# Patient Record
Sex: Male | Born: 1960
Health system: Southern US, Community
[De-identification: ages and names within clinical notes are randomized; demographics above are authoritative.]

## PROBLEM LIST (undated history)

## (undated) HISTORY — PX: KNEE SURGERY: SHX244

---

## 2014-08-21 ENCOUNTER — Encounter: Payer: Self-pay | Admitting: Internal Medicine

## 2014-08-21 ENCOUNTER — Ambulatory Visit: Payer: Self-pay | Attending: Internal Medicine | Admitting: Internal Medicine

## 2014-08-21 VITALS — BP 117/80 | HR 100 | Temp 98.8°F | Resp 16 | Ht 66.0 in | Wt 175.0 lb

## 2014-08-21 DIAGNOSIS — S8991XA Unspecified injury of right lower leg, initial encounter: Secondary | ICD-10-CM

## 2014-08-21 DIAGNOSIS — M25561 Pain in right knee: Secondary | ICD-10-CM | POA: Insufficient documentation

## 2014-08-21 DIAGNOSIS — M25461 Effusion, right knee: Secondary | ICD-10-CM | POA: Insufficient documentation

## 2014-08-21 NOTE — Progress Notes (Signed)
Pt is here today to establish care. Pt states that behind his right knee has been very painful for about 3 weeks especially in the morning.

## 2014-08-21 NOTE — Patient Instructions (Signed)
Smokeless Tobacco Use Smokeless tobacco is a loose, fine, or stringy tobacco. The tobacco is not smoked like a cigarette, but it is chewed or held in the lips or cheeks. It resembles tea and comes from the leaves of the tobacco plant. Smokeless tobacco is usually flavored, sweetened, or processed in some way. Although smokeless tobacco is not smoked into the lungs, its chemicals are absorbed through the membranes in the mouth and into the bloodstream. Its chemicals are also swallowed in saliva. The chemicals (nicotine and other toxins) are known to cause cancer. Smokeless tobacco contains up to 28 differentcarcinogens. CAUSES Nicotine is addictive. Smokeless tobacco contains nicotine, which is a stimulant. This stimulant can give you a "buzz" or altered state. People can become addicted to the feeling it delivers.  SYMPTOMS Smokeless tobacco can cause health problems, including:  Bad breath.  Yellow-brown teeth.  Mouth sores.  Cracking and bleeding lips.  Gum disease, gum recession, and bone loss around the teeth.  Tooth decay.  Increased or irregular heart rate.  High blood pressure, heart disease, and stroke.  Cancer of the mouth, lips, tongue, pancreas, voice box (larynx), esophagus, colon, and bladder.  Precancerous lesion of the soft tissues of the mouth (leukoplakia).  Loss of your sense of taste. TREATMENT Talk with your caregiver about ways you can quit. Quitting tobacco is a good decision for your health. Nicotine is addictive, but several options are available to help you quit including:  Nicotine replacement therapy (gum or patch).  Support and cessation programs. The following tips can help you quit:  Write down the reasons you would like to quit and look at them often.  Set a date during a low stress time to stop or cut back.  Ask family and friends for their support.  Remove all tobacco products from your home and work.  Replace the chewing tobacco with  things like beef jerky, sunflower seeds, or shredded coconut.  Avoid situations that may make you want to chew tobacco.  Exercise and eat a healthy diet.  When you crave tobacco, distract yourself with drinking water, sugarless chewing gum, sugarless hard candy, exercising, or deep breathing. HOME CARE INSTRUCTIONS  See your dentist for regular oral health exams every 6 months.  Follow up with your caregiver as recommended. SEEK MEDICAL CARE OR DENTAL CARE IF:  You have bleeding or cracking lips, gums, or cheeks.  You have mouth sores, discolorations, or pain.  You have tooth pain.  You develop persistent irritation, burning, or sores in the mouth.  You have pain, tenderness, or numbness in the mouth.  You develop a lump, bumpy patch, or hardened skin inside the mouth.  The color changes inside your mouth (gray, white, or red spots).  You have difficulty chewing, swallowing, or speaking. Document Released: 11/11/2010 Document Revised: 09/01/2011 Document Reviewed: 11/11/2010 Exit[employer] Patient Information 2015 [employer]. This information is not intended to replace advice given to you by your health care provider. Make sure you discuss any questions you have with your health care provider.  

## 2014-08-21 NOTE — Progress Notes (Signed)
   Subjective:    Patient ID: Kenneth Lucero, male    DOB: 05/27/1961, 54 y.o.   MRN: [id number]  Knee Pain  The incident occurred more than 1 week ago (3 weeks). The incident occurred in the yard. The injury mechanism is unknown. The pain is present in the right knee. Quality: pain worse over past 4 days-feels like a tight sensation/pulling. The pain is at a severity of 9/10 (mostly in th emorning). The pain has been constant since onset. Associated symptoms comments: Swelling, pain mostly in the popliteal area. The symptoms are aggravated by weight bearing. He has tried ice, heat and rest for the symptoms. The treatment provided no relief.      Review of Systems  Musculoskeletal: Positive for joint swelling and arthralgias.  All other systems reviewed and are negative.      Objective:   Physical Exam  Cardiovascular: Normal rate, regular rhythm and normal heart sounds.   Pulmonary/Chest: Effort normal and breath sounds normal.  Abdominal: Soft. Bowel sounds are normal.  Musculoskeletal: He exhibits edema (right knee). He exhibits no tenderness.  Limited ROM Unable to fully bend  Skin: Skin is warm and dry.          Assessment & Plan:  Kenneth MaduroRobert was seen today for establish care.  Diagnoses and all orders for this visit:  Right knee injury, initial encounter Orders: -     MR Knee Right Wo Contrast; Future I have high suspicion that patient may have a tear. Will obtain MRI and call patient back with results. He states that he does not need pain medication, he will just take tylenol if needed.   Return if symptoms worsen or fail to improve.  Holland CommonsKECK, Yosiel Thieme, NP 08/22/2014 10:32 AM

## 2014-08-22 ENCOUNTER — Telehealth: Payer: Self-pay | Admitting: *Deleted

## 2014-08-22 NOTE — Telephone Encounter (Signed)
Pt aware of appointment 

## 2014-08-22 NOTE — Telephone Encounter (Signed)
MRI appointment on march 17,2016  At 01:00 arriving 15 early  Left voice message with male to return call

## 2014-09-04 ENCOUNTER — Ambulatory Visit: Payer: Self-pay | Attending: Internal Medicine

## 2014-09-07 ENCOUNTER — Ambulatory Visit (HOSPITAL_COMMUNITY)
Admission: RE | Admit: 2014-09-07 | Discharge: 2014-09-07 | Disposition: A | Discharge status: Home / Self Care | Payer: Self-pay | Source: Ambulatory Visit | Attending: Internal Medicine | Admitting: Internal Medicine

## 2014-09-07 DIAGNOSIS — M25561 Pain in right knee: Secondary | ICD-10-CM | POA: Insufficient documentation

## 2014-09-07 DIAGNOSIS — S8991XA Unspecified injury of right lower leg, initial encounter: Secondary | ICD-10-CM

## 2014-09-14 ENCOUNTER — Encounter: Payer: Self-pay | Admitting: Internal Medicine

## 2014-09-19 ENCOUNTER — Telehealth: Payer: Self-pay | Admitting: Internal Medicine

## 2014-09-19 NOTE — Telephone Encounter (Signed)
Pt called requesting MRI results, please f/u with pt to review results

## 2014-09-25 ENCOUNTER — Telehealth: Payer: Self-pay | Admitting: *Deleted

## 2014-09-25 NOTE — Telephone Encounter (Signed)
Left message on VM to return my call. 

## 2014-09-25 NOTE — Telephone Encounter (Signed)
-----   Message from Ambrose FinlandValerie A Keck, NP sent at 09/14/2014  6:17 PM EDT ----- Small baker's cyst and joint effusion. Patient will need ortho referral for treatment. Thanks

## 2014-10-13 ENCOUNTER — Encounter: Payer: Self-pay | Admitting: *Deleted

## 2014-10-13 DIAGNOSIS — M7120 Synovial cyst of popliteal space [Baker], unspecified knee: Secondary | ICD-10-CM

## 2014-10-13 NOTE — Progress Notes (Signed)
Gave patient results of MRI and put in ortho referral

## 2017-09-09 ENCOUNTER — Encounter: Payer: Self-pay | Admitting: Nurse Practitioner

## 2017-09-09 ENCOUNTER — Ambulatory Visit: Payer: Self-pay | Attending: Nurse Practitioner | Admitting: Nurse Practitioner

## 2017-09-09 VITALS — BP 125/88 | HR 92 | Temp 97.9°F | Ht 66.0 in | Wt 176.4 lb

## 2017-09-09 DIAGNOSIS — M5442 Lumbago with sciatica, left side: Secondary | ICD-10-CM | POA: Insufficient documentation

## 2017-09-09 DIAGNOSIS — M5441 Lumbago with sciatica, right side: Secondary | ICD-10-CM | POA: Insufficient documentation

## 2017-09-09 DIAGNOSIS — M549 Dorsalgia, unspecified: Secondary | ICD-10-CM | POA: Insufficient documentation

## 2017-09-09 DIAGNOSIS — G8929 Other chronic pain: Secondary | ICD-10-CM | POA: Insufficient documentation

## 2017-09-09 MED ORDER — NAPROXEN 500 MG PO TABS: 500.0000 mg | ORAL_TABLET | Freq: Two times a day (BID) | ORAL | 0 refills | Status: DC

## 2017-09-09 MED ORDER — CYCLOBENZAPRINE HCL 10 MG PO TABS: 10.0000 mg | ORAL_TABLET | Freq: Three times a day (TID) | ORAL | 0 refills | Status: DC | PRN

## 2017-09-09 MED FILL — NAPROXEN 500 MG TABLET: 500 | 30 days supply | Qty: 60 | Fill #0

## 2017-09-09 MED FILL — CYCLOBENZAPRINE 10 MG TAB: 10 | 20 days supply | Qty: 60 | Fill #0

## 2017-09-09 NOTE — Progress Notes (Signed)
Assessment & Plan:  [name] was seen today for establish care and back pain.  Diagnoses and all orders for this visit:  Chronic bilateral low back pain with bilateral sciatica -     naproxen (NAPROSYN) 500 MG tablet; Take 1 tablet (500 mg total) by mouth 2 (two) times daily with a meal. -     cyclobenzaprine (FLEXERIL) 10 MG tablet; Take 1 tablet (10 mg total) by mouth 3 (three) times daily as needed for muscle spasms.  Work on losing weight to help reduce back pain. May alternate with heat and ice application for pain relief. May also alternate with acetaminophen as prescribed for back pain. Other alternatives include massage, acupuncture and water aerobics.  You must stay active and avoid a sedentary lifestyle.      Patient has been counseled on age-appropriate routine health concerns for screening and prevention. These are reviewed and up-to-date. Referrals have been placed accordingly. Immunizations are up-to-date or declined.    Subjective:   Chief Complaint  Patient presents with  . Establish Care    Patient is here to establish care for back pain.   . Back Pain    Patient stated been going on for a year and get worst. Pt's stated he think he twisted a muscle on his back.  Put ice no helps.    HPI Kenneth Lucero C Bolon 57 y.o. male presents to office today to establish care.   Chronic Back Pain He injured his back a year ago picking up large tree stumps. Now with worsening back pain. Pain described as dull and achy.  Aggravating factors: bending, sitting, lying on back or prone position. Relieving factors: ice application, stretching exercises and lying on side, standing. Pain is present most of the day. He has taken Equate ASA XS. He does endorse numbness in his bilateral legs after driving for long distances.   Review of Systems  Constitutional: Negative for fever, malaise/fatigue and weight loss.  HENT: Negative.  Negative for nosebleeds.   Eyes: Negative.  Negative for  blurred vision, double vision and photophobia.  Respiratory: Negative.  Negative for cough and shortness of breath.   Cardiovascular: Negative.  Negative for chest pain, palpitations and leg swelling.  Gastrointestinal: Negative.  Negative for heartburn, nausea and vomiting.  Musculoskeletal: Positive for back pain and myalgias.  Neurological: Negative.  Negative for dizziness, focal weakness, seizures and headaches.  Psychiatric/Behavioral: Negative.  Negative for suicidal ideas.    History reviewed. No pertinent past medical history.  Past Surgical History:  Procedure Laterality Date  . KNEE SURGERY Right     Family History  Problem Relation Age of Onset  . Cancer Neg Hx   . Diabetes Neg Hx   . Hypertension Neg Hx     Social History Reviewed with no changes to be made today.   No outpatient medications prior to visit.   No facility-administered medications prior to visit.     No Known Allergies     Objective:    BP 125/88 (BP Location: Right Arm, Patient Position: Sitting, Cuff Size: Normal)   Pulse 92   Temp 97.9 F (36.6 C) (Oral)   Ht 5\' 6"  (1.676 m)   Wt 176 lb 6.4 oz (80 kg)   SpO2 98%   BMI 28.47 kg/m  Wt Readings from Last 3 Encounters:  09/09/17 176 lb 6.4 oz (80 kg)  08/21/14 175 lb (79.4 kg)    Physical Exam  Constitutional: He is oriented to person, place, and time.  He appears well-developed and well-nourished. He is cooperative.  HENT:  Head: Normocephalic and atraumatic.    Eyes: EOM are normal.  Neck: Normal range of motion.  Cardiovascular: Normal rate, regular rhythm, normal heart sounds and intact distal pulses. Exam reveals no gallop and no friction rub.  No murmur heard. Pulmonary/Chest: Effort normal and breath sounds normal. No tachypnea. He has no decreased breath sounds. He has no rhonchi.  Abdominal: Soft. Bowel sounds are normal.  Musculoskeletal: Normal range of motion. He exhibits no edema.       Lumbar back: He exhibits pain.   Neurological: He is alert and oriented to person, place, and time. Coordination normal.  Skin: Skin is warm and dry.  Psychiatric: He has a normal mood and affect. His behavior is normal. Judgment and thought content normal.  Nursing note and vitals reviewed.     Patient has been counseled extensively about nutrition and exercise as well as the importance of adherence with medications and regular follow-up. The patient was given clear instructions to go to ER or return to medical center if symptoms don't improve, worsen or new problems develop. The patient verbalized understanding.   Follow-up: Return in about 3 weeks (around 09/30/2017) for back pain.   Claiborne Rigg, FNP-BC [facility] [place], [place] [phone]   09/09/2017, 10:38 AM

## 2017-09-09 NOTE — Patient Instructions (Addendum)
Back Exercises The following exercises strengthen the muscles that help to support the back. They also help to keep the lower back flexible. Doing these exercises can help to prevent back pain or lessen existing pain. If you have back pain or discomfort, try doing these exercises 2-3 times each day or as told by your health care provider. When the pain goes away, do them once each day, but increase the number of times that you repeat the steps for each exercise (do more repetitions). If you do not have back pain or discomfort, do these exercises once each day or as told by your health care provider. Exercises Single Knee to Chest  Repeat these steps 3-5 times for each leg: 1. Lie on your back on a firm bed or the floor with your legs extended. 2. Bring one knee to your chest. Your other leg should stay extended and in contact with the floor. 3. Hold your knee in place by grabbing your knee or thigh. 4. Pull on your knee until you feel a gentle stretch in your lower back. 5. Hold the stretch for 10-30 seconds. 6. Slowly release and straighten your leg.  Pelvic Tilt  Repeat these steps 5-10 times: 1. Lie on your back on a firm bed or the floor with your legs extended. 2. Bend your knees so they are pointing toward the ceiling and your feet are flat on the floor. 3. Tighten your lower abdominal muscles to press your lower back against the floor. This motion will tilt your pelvis so your tailbone points up toward the ceiling instead of pointing to your feet or the floor. 4. With gentle tension and even breathing, hold this position for 5-10 seconds.  Cat-Cow  Repeat these steps until your lower back becomes more flexible: 1. Get into a hands-and-knees position on a firm surface. Keep your hands under your shoulders, and keep your knees under your hips. You may place padding under your knees for comfort. 2. Let your head hang down, and point your tailbone toward the floor so your lower back  becomes rounded like the back of a cat. 3. Hold this position for 5 seconds. 4. Slowly lift your head and point your tailbone up toward the ceiling so your back forms a sagging arch like the back of a cow. 5. Hold this position for 5 seconds.  Press-Ups  Repeat these steps 5-10 times: 1. Lie on your abdomen (face-down) on the floor. 2. Place your palms near your head, about shoulder-width apart. 3. While you keep your back as relaxed as possible and keep your hips on the floor, slowly straighten your arms to raise the top half of your body and lift your shoulders. Do not use your back muscles to raise your upper torso. You may adjust the placement of your hands to make yourself more comfortable. 4. Hold this position for 5 seconds while you keep your back relaxed. 5. Slowly return to lying flat on the floor.  Bridges  Repeat these steps 10 times: 1. Lie on your back on a firm surface. 2. Bend your knees so they are pointing toward the ceiling and your feet are flat on the floor. 3. Tighten your buttocks muscles and lift your buttocks off of the floor until your waist is at almost the same height as your knees. You should feel the muscles working in your buttocks and the back of your thighs. If you do not feel these muscles, slide your feet 1-2 inches farther away   from your buttocks. 4. Hold this position for 3-5 seconds. 5. Slowly lower your hips to the starting position, and allow your buttocks muscles to relax completely.  If this exercise is too easy, try doing it with your arms crossed over your chest. Abdominal Crunches  Repeat these steps 5-10 times: 1. Lie on your back on a firm bed or the floor with your legs extended. 2. Bend your knees so they are pointing toward the ceiling and your feet are flat on the floor. 3. Cross your arms over your chest. 4. Tip your chin slightly toward your chest without bending your neck. 5. Tighten your abdominal muscles and slowly raise your  trunk (torso) high enough to lift your shoulder blades a tiny bit off of the floor. Avoid raising your torso higher than that, because it can put too much stress on your low back and it does not help to strengthen your abdominal muscles. 6. Slowly return to your starting position.  Back Lifts Repeat these steps 5-10 times: 1. Lie on your abdomen (face-down) with your arms at your sides, and rest your forehead on the floor. 2. Tighten the muscles in your legs and your buttocks. 3. Slowly lift your chest off of the floor while you keep your hips pressed to the floor. Keep the back of your head in line with the curve in your back. Your eyes should be looking at the floor. 4. Hold this position for 3-5 seconds. 5. Slowly return to your starting position.  Contact a health care provider if:  Your back pain or discomfort gets much worse when you do an exercise.  Your back pain or discomfort does not lessen within 2 hours after you exercise. If you have any of these problems, stop doing these exercises right away. Do not do them again unless your health care provider says that you can. Get help right away if:  You develop sudden, severe back pain. If this happens, stop doing the exercises right away. Do not do them again unless your health care provider says that you can. This information is not intended to replace advice given to you by your health care provider. Make sure you discuss any questions you have with your health care provider. Document Released: 07/17/2004 Document Revised: 10/17/2015 Document Reviewed: 08/03/2014 Elsevier Interactive Patient Education  2017 [employer]  Back Pain, Adult Back pain is very common. The pain often gets better over time. The cause of back pain is usually not dangerous. Most people can learn to manage their back pain on their own. Follow these instructions at home: Watch your back pain for any changes. The following actions may help to lessen any pain  you are feeling:  Stay active. Start with short walks on flat ground if you can. Try to walk farther each day.  Exercise regularly as told by your doctor. Exercise helps your back heal faster. It also helps avoid future injury by keeping your muscles strong and flexible.  Do not sit, drive, or stand in one place for more than 30 minutes.  Do not stay in bed. Resting more than 1-2 days can slow down your recovery.  Be careful when you bend or lift an object. Use good form when lifting: ? Bend at your knees. ? Keep the object close to your body. ? Do not twist.  Sleep on a firm mattress. Lie on your side, and bend your knees. If you lie on your back, put a pillow under your knees.    Take medicines only as told by your doctor.  Put ice on the injured area. ? Put ice in a plastic bag. ? Place a towel between your skin and the bag. ? Leave the ice on for 20 minutes, 2-3 times a day for the first 2-3 days. After that, you can switch between ice and heat packs.  Avoid feeling anxious or stressed. Find good ways to deal with stress, such as exercise.  Maintain a healthy weight. Extra weight puts stress on your back.  Contact a doctor if:  You have pain that does not go away with rest or medicine.  You have worsening pain that goes down into your legs or buttocks.  You have pain that does not get better in one week.  You have pain at night.  You lose weight.  You have a fever or chills. Get help right away if:  You cannot control when you poop (bowel movement) or pee (urinate).  Your arms or legs feel weak.  Your arms or legs lose feeling (numbness).  You feel sick to your stomach (nauseous) or throw up (vomit).  You have belly (abdominal) pain.  You feel like you may pass out (faint). This information is not intended to replace advice given to you by your health care provider. Make sure you discuss any questions you have with your health care provider. Document Released:  11/26/2007 Document Revised: 11/15/2015 Document Reviewed: 10/11/2013 Elsevier Interactive Patient Education  2018 [employer]  

## 2017-09-14 ENCOUNTER — Ambulatory Visit: Payer: Self-pay | Attending: Nurse Practitioner

## 2017-09-30 ENCOUNTER — Ambulatory Visit: Payer: Self-pay | Attending: Nurse Practitioner | Admitting: Nurse Practitioner

## 2017-09-30 ENCOUNTER — Encounter: Payer: Self-pay | Admitting: Nurse Practitioner

## 2017-09-30 VITALS — BP 112/79 | HR 95 | Temp 97.9°F | Ht 66.0 in | Wt 177.0 lb

## 2017-09-30 DIAGNOSIS — M5441 Lumbago with sciatica, right side: Secondary | ICD-10-CM | POA: Insufficient documentation

## 2017-09-30 DIAGNOSIS — G8929 Other chronic pain: Secondary | ICD-10-CM | POA: Insufficient documentation

## 2017-09-30 DIAGNOSIS — M5442 Lumbago with sciatica, left side: Secondary | ICD-10-CM | POA: Insufficient documentation

## 2017-09-30 MED ORDER — CYCLOBENZAPRINE HCL 10 MG PO TABS: 10.0000 mg | ORAL_TABLET | Freq: Three times a day (TID) | ORAL | 1 refills | Status: DC | PRN

## 2017-09-30 MED ORDER — NAPROXEN 500 MG PO TABS
500.0000 mg | ORAL_TABLET | Freq: Two times a day (BID) | ORAL | 1 refills | Status: DC
Start: 2017-09-30 — End: 2017-11-30

## 2017-09-30 NOTE — Patient Instructions (Signed)
Back Exercises The following exercises strengthen the muscles that help to support the back. They also help to keep the lower back flexible. Doing these exercises can help to prevent back pain or lessen existing pain. If you have back pain or discomfort, try doing these exercises 2-3 times each day or as told by your health care provider. When the pain goes away, do them once each day, but increase the number of times that you repeat the steps for each exercise (do more repetitions). If you do not have back pain or discomfort, do these exercises once each day or as told by your health care provider. Exercises Single Knee to Chest  Repeat these steps 3-5 times for each leg: 1. Lie on your back on a firm bed or the floor with your legs extended. 2. Bring one knee to your chest. Your other leg should stay extended and in contact with the floor. 3. Hold your knee in place by grabbing your knee or thigh. 4. Pull on your knee until you feel a gentle stretch in your lower back. 5. Hold the stretch for 10-30 seconds. 6. Slowly release and straighten your leg.  Pelvic Tilt  Repeat these steps 5-10 times: 1. Lie on your back on a firm bed or the floor with your legs extended. 2. Bend your knees so they are pointing toward the ceiling and your feet are flat on the floor. 3. Tighten your lower abdominal muscles to press your lower back against the floor. This motion will tilt your pelvis so your tailbone points up toward the ceiling instead of pointing to your feet or the floor. 4. With gentle tension and even breathing, hold this position for 5-10 seconds.  Cat-Cow  Repeat these steps until your lower back becomes more flexible: 1. Get into a hands-and-knees position on a firm surface. Keep your hands under your shoulders, and keep your knees under your hips. You may place padding under your knees for comfort. 2. Let your head hang down, and point your tailbone toward the floor so your lower back  becomes rounded like the back of a cat. 3. Hold this position for 5 seconds. 4. Slowly lift your head and point your tailbone up toward the ceiling so your back forms a sagging arch like the back of a cow. 5. Hold this position for 5 seconds.  Press-Ups  Repeat these steps 5-10 times: 1. Lie on your abdomen (face-down) on the floor. 2. Place your palms near your head, about shoulder-width apart. 3. While you keep your back as relaxed as possible and keep your hips on the floor, slowly straighten your arms to raise the top half of your body and lift your shoulders. Do not use your back muscles to raise your upper torso. You may adjust the placement of your hands to make yourself more comfortable. 4. Hold this position for 5 seconds while you keep your back relaxed. 5. Slowly return to lying flat on the floor.  Bridges  Repeat these steps 10 times: 1. Lie on your back on a firm surface. 2. Bend your knees so they are pointing toward the ceiling and your feet are flat on the floor. 3. Tighten your buttocks muscles and lift your buttocks off of the floor until your waist is at almost the same height as your knees. You should feel the muscles working in your buttocks and the back of your thighs. If you do not feel these muscles, slide your feet 1-2 inches farther away   from your buttocks. 4. Hold this position for 3-5 seconds. 5. Slowly lower your hips to the starting position, and allow your buttocks muscles to relax completely.  If this exercise is too easy, try doing it with your arms crossed over your chest. Abdominal Crunches  Repeat these steps 5-10 times: 1. Lie on your back on a firm bed or the floor with your legs extended. 2. Bend your knees so they are pointing toward the ceiling and your feet are flat on the floor. 3. Cross your arms over your chest. 4. Tip your chin slightly toward your chest without bending your neck. 5. Tighten your abdominal muscles and slowly raise your  trunk (torso) high enough to lift your shoulder blades a tiny bit off of the floor. Avoid raising your torso higher than that, because it can put too much stress on your low back and it does not help to strengthen your abdominal muscles. 6. Slowly return to your starting position.  Back Lifts Repeat these steps 5-10 times: 1. Lie on your abdomen (face-down) with your arms at your sides, and rest your forehead on the floor. 2. Tighten the muscles in your legs and your buttocks. 3. Slowly lift your chest off of the floor while you keep your hips pressed to the floor. Keep the back of your head in line with the curve in your back. Your eyes should be looking at the floor. 4. Hold this position for 3-5 seconds. 5. Slowly return to your starting position.  Contact a health care provider if:  Your back pain or discomfort gets much worse when you do an exercise.  Your back pain or discomfort does not lessen within 2 hours after you exercise. If you have any of these problems, stop doing these exercises right away. Do not do them again unless your health care provider says that you can. Get help right away if:  You develop sudden, severe back pain. If this happens, stop doing the exercises right away. Do not do them again unless your health care provider says that you can. This information is not intended to replace advice given to you by your health care provider. Make sure you discuss any questions you have with your health care provider. Document Released: 07/17/2004 Document Revised: 10/17/2015 Document Reviewed: 08/03/2014 Elsevier Interactive Patient Education  2017 [employer]  Chronic Back Pain When back pain lasts longer than 3 months, it is called chronic back pain.The cause of your back pain may not be known. Some common causes include:  Wear and tear (degenerative disease) of the bones, ligaments, or disks in your back.  Inflammation and stiffness in your back  (arthritis).  People who have chronic back pain often go through certain periods in which the pain is more intense (flare-ups). Many people can learn to manage the pain with home care. Follow these instructions at home: Pay attention to any changes in your symptoms. Take these actions to help with your pain: Activity  Avoid bending and activities that make the problem worse.  Do not sit or stand in one place for long periods of time.  Take brief periods of rest throughout the day. This will reduce your pain. Resting in a lying or standing position is usually better than sitting to rest.  When you are resting for longer periods, mix in some mild activity or stretching between periods of rest. This will help to prevent stiffness and pain.  Get regular exercise. Ask your health care provider what activities  are safe for you.  Do not lift anything that is heavier than 10 lb (4.5 kg). Always use proper lifting technique, which includes: ? Bending your knees. ? Keeping the load close to your body. ? Avoiding twisting. Managing pain  If directed, apply ice to the painful area. Your health care provider may recommend applying ice during the first 24-48 hours after a flare-up begins. ? Put ice in a plastic bag. ? Place a towel between your skin and the bag. ? Leave the ice on for 20 minutes, 2-3 times per day.  After icing, apply heat to the affected area as often as told by your health care provider. Use the heat source that your health care provider recommends, such as a moist heat pack or a heating pad. ? Place a towel between your skin and the heat source. ? Leave the heat on for 20-30 minutes. ? Remove the heat if your skin turns bright red. This is especially important if you are unable to feel pain, heat, or cold. You may have a greater risk of getting burned.  Try soaking in a warm tub.  Take over-the-counter and prescription medicines only as told by your health care  provider.  Keep all follow-up visits as told by your health care provider. This is important. Contact a health care provider if:  You have pain that is not relieved with rest or medicine. Get help right away if:  You have weakness or numbness in one or both of your legs or feet.  You have trouble controlling your bladder or your bowels.  You have nausea or vomiting.  You have pain in your abdomen.  You have shortness of breath or you faint. This information is not intended to replace advice given to you by your health care provider. Make sure you discuss any questions you have with your health care provider. Document Released: 07/17/2004 Document Revised: 10/18/2015 Document Reviewed: 11/27/2014 Elsevier Interactive Patient Education  2018 [employer]

## 2017-09-30 NOTE — Progress Notes (Signed)
Assessment & Plan:  Kenneth MaduroRobert was seen today for follow-up.  Diagnoses and all orders for this visit:  Chronic bilateral low back pain with bilateral sciatica -     cyclobenzaprine (FLEXERIL) 10 MG tablet; Take 1 tablet (10 mg total) by mouth 3 (three) times daily as needed for muscle spasms. -     naproxen (NAPROSYN) 500 MG tablet; Take 1 tablet (500 mg total) by mouth 2 (two) times daily with a meal. Work on losing weight to help reduce back pain. May alternate with heat and ice application for pain relief. May also alternate with acetaminophen  as prescribed for back pain. Other alternatives include massage, acupuncture and water aerobics.  You must stay active and avoid a sedentary lifestyle.    Patient has been counseled on age-appropriate routine health concerns for screening and prevention. These are reviewed and up-to-date. Referrals have been placed accordingly. Immunizations are up-to-date or declined.    Subjective:   Chief Complaint  Patient presents with  . Follow-up    Pt. is here for a follow-up on back pain. Pt. stated back pain is tolerable and Flexeril is helping him. In process of doing [employer] Assistance.    HPI Kenneth Lucero 57 y.o. male presents to office today for follow up to back pain.   Back Pain This is chronic in nature and has been ongoing for over a year now.  Patient had an injury to his back due to peaking up a large trees.  He endorses pain is dull and achy and persistent.  Aggravating factors of bending, sitting, lying on back or prone.  At his last appointment with me on 09/09/2017 he was prescribed Flexeril and naproxen which he reports today have provided significant relief of his symptoms.  At this time will continue with the same medications.  Patient does need imaging on his lumbar spine however he uninsured and is still waiting to be approved for the orange colored/financial assistance.  Review of Systems  Constitutional: Negative for fever,  malaise/fatigue and weight loss.  Eyes: Negative.  Negative for blurred vision, double vision and photophobia.  Respiratory: Negative.  Negative for cough and shortness of breath.   Cardiovascular: Negative.  Negative for chest pain, palpitations and leg swelling.  Gastrointestinal: Negative.  Negative for heartburn, nausea and vomiting.  Musculoskeletal: Positive for back pain and neck pain. Negative for myalgias.       SEE HPI  Neurological: Negative.  Negative for dizziness, focal weakness, seizures and headaches.  Psychiatric/Behavioral: Negative.  Negative for suicidal ideas.    History reviewed. No pertinent past medical history.  Past Surgical History:  Procedure Laterality Date  . KNEE SURGERY Right     Family History  Problem Relation Age of Onset  . Cancer Neg Hx   . Diabetes Neg Hx   . Hypertension Neg Hx     Social History Reviewed with no changes to be made today.   Outpatient Medications Prior to Visit  Medication Sig Dispense Refill  . cyclobenzaprine (FLEXERIL) 10 MG tablet Take 1 tablet (10 mg total) by mouth 3 (three) times daily as needed for muscle spasms. 60 tablet 0  . naproxen (NAPROSYN) 500 MG tablet Take 1 tablet (500 mg total) by mouth 2 (two) times daily with a meal. 60 tablet 0   No facility-administered medications prior to visit.     No Known Allergies     Objective:    BP 112/79 (BP Location: Right Arm, Patient Position: Sitting, Cuff  Size: Normal)   Pulse 95   Temp 97.9 F (36.6 C) (Oral)   Ht 5\' 6"  (1.676 m)   Wt 177 lb (80.3 kg)   SpO2 98%   BMI 28.57 kg/m  Wt Readings from Last 3 Encounters:  09/30/17 177 lb (80.3 kg)  09/09/17 176 lb 6.4 oz (80 kg)  08/21/14 175 lb (79.4 kg)    Physical Exam  Constitutional: He is oriented to person, place, and time. He appears well-developed and well-nourished. He is cooperative.  HENT:  Head: Normocephalic and atraumatic.  Eyes: EOM are normal.  Neck: Normal range of motion.    Cardiovascular: Normal rate, regular rhythm and normal heart sounds. Exam reveals no gallop and no friction rub.  No murmur heard. Pulmonary/Chest: Effort normal and breath sounds normal. No tachypnea. No respiratory distress. He has no decreased breath sounds. He has no wheezes. He has no rhonchi. He has no rales. He exhibits no tenderness.  Abdominal: Soft. Bowel sounds are normal.  Musculoskeletal: Normal range of motion. He exhibits no edema.       Lumbar back: He exhibits pain. He exhibits no swelling and no edema.  Neurological: He is alert and oriented to person, place, and time. Coordination normal.  Skin: Skin is warm and dry.  Psychiatric: He has a normal mood and affect. His behavior is normal. Judgment and thought content normal.  Nursing note and vitals reviewed.     Patient has been counseled extensively about nutrition and exercise as well as the importance of adherence with medications and regular follow-up. The patient was given clear instructions to go to ER or return to medical center if symptoms don't improve, worsen or new problems develop. The patient verbalized understanding.   Follow-up: Return in about 2 months (around 11/30/2017).   Claiborne Rigg, FNP-BC [facility] [place], [place] [phone]   09/30/2017, 1:34 PM

## 2017-10-23 MED FILL — NAPROXEN 500 MG TABLET: 500 | 30 days supply | Qty: 60 | Fill #0

## 2017-10-23 MED FILL — CYCLOBENZAPRINE 10 MG TAB: 10 | 20 days supply | Qty: 60 | Fill #0

## 2017-11-06 ENCOUNTER — Telehealth: Payer: Self-pay | Admitting: Nurse Practitioner

## 2017-11-06 ENCOUNTER — Ambulatory Visit: Payer: Self-pay | Attending: Nurse Practitioner

## 2017-11-06 NOTE — Telephone Encounter (Signed)
I called Pt, LVM to pick up his blue and [place] at the front office

## 2017-11-30 ENCOUNTER — Ambulatory Visit: Payer: Self-pay | Attending: Nurse Practitioner | Admitting: Nurse Practitioner

## 2017-11-30 ENCOUNTER — Encounter: Payer: Self-pay | Admitting: Nurse Practitioner

## 2017-11-30 VITALS — BP 123/84 | HR 92 | Temp 98.1°F | Ht 66.0 in | Wt 172.0 lb

## 2017-11-30 DIAGNOSIS — M5441 Lumbago with sciatica, right side: Secondary | ICD-10-CM | POA: Insufficient documentation

## 2017-11-30 DIAGNOSIS — M5442 Lumbago with sciatica, left side: Secondary | ICD-10-CM | POA: Insufficient documentation

## 2017-11-30 DIAGNOSIS — Z09 Encounter for follow-up examination after completed treatment for conditions other than malignant neoplasm: Secondary | ICD-10-CM | POA: Insufficient documentation

## 2017-11-30 DIAGNOSIS — Z9889 Other specified postprocedural states: Secondary | ICD-10-CM | POA: Insufficient documentation

## 2017-11-30 DIAGNOSIS — G8929 Other chronic pain: Secondary | ICD-10-CM

## 2017-11-30 DIAGNOSIS — Z79899 Other long term (current) drug therapy: Secondary | ICD-10-CM | POA: Insufficient documentation

## 2017-11-30 DIAGNOSIS — R221 Localized swelling, mass and lump, neck: Secondary | ICD-10-CM

## 2017-11-30 DIAGNOSIS — Z0000 Encounter for general adult medical examination without abnormal findings: Secondary | ICD-10-CM

## 2017-11-30 MED ORDER — OMEPRAZOLE 20 MG PO CPDR: 20.0000 mg | DELAYED_RELEASE_CAPSULE | Freq: Every day | ORAL | 3 refills | Status: DC

## 2017-11-30 MED ORDER — CYCLOBENZAPRINE HCL 10 MG PO TABS: 10.0000 mg | ORAL_TABLET | Freq: Three times a day (TID) | ORAL | 1 refills | Status: DC | PRN

## 2017-11-30 MED ORDER — NAPROXEN 500 MG PO TABS: 500.0000 mg | ORAL_TABLET | Freq: Two times a day (BID) | ORAL | 1 refills | Status: DC

## 2017-11-30 MED FILL — NAPROXEN 500 MG TABLET: 500 | 30 days supply | Qty: 60 | Fill #0

## 2017-11-30 MED FILL — CYCLOBENZAPRINE 10 MG TAB: 10 | 20 days supply | Qty: 60 | Fill #0

## 2017-11-30 MED FILL — OMEPRAZOLE 20 MG CAP: 20 | 30 days supply | Qty: 30 | Fill #0

## 2017-11-30 NOTE — Progress Notes (Signed)
Assessment & Plan:  Kenneth Lucero was seen today for follow-up.  Diagnoses and all orders for this visit:  Chronic bilateral low back pain with bilateral sciatica -     DG Lumbar Spine Complete; Future -     cyclobenzaprine (FLEXERIL) 10 MG tablet; Take 1 tablet (10 mg total) by mouth 3 (three) times daily as needed for muscle spasms. -     naproxen (NAPROSYN) 500 MG tablet; Take 1 tablet (500 mg total) by mouth 2 (two) times daily with a meal. -     omeprazole (PRILOSEC) 20 MG capsule; Take 1 capsule (20 mg total) by mouth daily. (prophylaxis for Naproxen/Ulcer reducer)  Work on losing weight to help reduce back pain. May alternate with heat and ice application for pain relief. May also alternate with acetaminophen as prescribed for back pain. Other alternatives include massage, acupuncture and water aerobics.  You must stay active and avoid a sedentary lifestyle.  Neck mass -     US Soft Tissue Head/Neck; Future  Routine adult health maintenance -     Hepatitis C Antibody -     HIV antibody (with reflex) -     CBC -     CMP14+EGFR -     Lipid panel -     PSA    Patient has been counseled on age-appropriate routine health concerns for screening and prevention. These are reviewed and up-to-date. Referrals have been placed accordingly. Immunizations are up-to-date or declined.    Subjective:   Chief Complaint  Patient presents with  . Follow-up    Pt. is here for his back pain. Pt. stated it got worst after he did lawn work.    HPI Kenneth Lucero 57 y.o. male presents to office today to follow up for chronic low back pain.  Chronic Low Back Pain Ongoing. Onset >1 year. Back pain due to injury. Patient injured back picking up a large tree. He has tried flexeril and naproxen in the past which provided some  relief of his symptoms. Pain is radiating to BLE. He states he mowed his grass with his riding lawnmower and experienced increased back pain thereafter. This was a few weeks ago  and today he reports the increased pain has mostly subsided. Aggravating factors: Bending, twisting, stooping, sitting or standing for prolonged periods of time. He is doing well taking naproxen and flexeril. Will order blood work today to evaluate for anemia and renal function.   Review of Systems  Constitutional: Negative for fever, malaise/fatigue and weight loss.  HENT: Negative.  Negative for nosebleeds.   Eyes: Negative.  Negative for blurred vision, double vision and photophobia.  Respiratory: Negative.  Negative for cough and shortness of breath.   Cardiovascular: Negative.  Negative for chest pain, palpitations and leg swelling.  Gastrointestinal: Negative.  Negative for heartburn, nausea and vomiting.  Musculoskeletal: Positive for back pain and myalgias. Negative for falls and joint pain.  Neurological: Negative.  Negative for dizziness, focal weakness, seizures and headaches.  Psychiatric/Behavioral: Negative.  Negative for suicidal ideas.    History reviewed. No pertinent past medical history.  Past Surgical History:  Procedure Laterality Date  . KNEE SURGERY Right     Family History  Problem Relation Age of Onset  . Cancer Neg Hx   . Diabetes Neg Hx   . Hypertension Neg Hx     Social History Reviewed with no changes to be made today.   Outpatient Medications Prior to Visit  Medication Sig Dispense Refill  .  cyclobenzaprine (FLEXERIL) 10 MG tablet Take 1 tablet (10 mg total) by mouth 3 (three) times daily as needed for muscle spasms. 60 tablet 1  . naproxen (NAPROSYN) 500 MG tablet Take 1 tablet (500 mg total) by mouth 2 (two) times daily with a meal. 60 tablet 1   No facility-administered medications prior to visit.     No Known Allergies     Objective:    BP 123/84 (BP Location: Left Arm, Patient Position: Sitting, Cuff Size: Normal)   Pulse 92   Temp 98.1 F (36.7 C) (Oral)   Ht '5\' 6"'$  (1.676 m)   Wt 172 lb (78 kg)   SpO2 99%   BMI 27.76 kg/m  Wt  Readings from Last 3 Encounters:  11/30/17 172 lb (78 kg)  09/30/17 177 lb (80.3 kg)  09/09/17 176 lb 6.4 oz (80 kg)    Physical Exam  Constitutional: He is oriented to person, place, and time. He appears well-developed and well-nourished. He is cooperative.  HENT:  Head: Normocephalic and atraumatic.  Eyes: EOM are normal.  Neck: Normal range of motion.    Cardiovascular: Normal rate, regular rhythm and normal heart sounds. Exam reveals no gallop and no friction rub.  No murmur heard. Pulmonary/Chest: Effort normal and breath sounds normal. No tachypnea. No respiratory distress. He has no decreased breath sounds. He has no wheezes. He has no rhonchi. He has no rales. He exhibits no tenderness.  Abdominal: Soft. Bowel sounds are normal.  Musculoskeletal: He exhibits no edema.       Lumbar back: He exhibits decreased range of motion, tenderness and pain. He exhibits no swelling and no edema.  Neurological: He is alert and oriented to person, place, and time. Coordination normal.  Skin: Skin is warm and dry.  Psychiatric: He has a normal mood and affect. His behavior is normal. Judgment and thought content normal.  Nursing note and vitals reviewed.      Patient has been counseled extensively about nutrition and exercise as well as the importance of adherence with medications and regular follow-up. The patient was given clear instructions to go to ER or return to medical center if symptoms don't improve, worsen or new problems develop. The patient verbalized understanding.   Follow-up: Return in about 6 weeks (around 01/11/2018) for back pain.   Gildardo Pounds, FNP-BC [facility] and [place], [place]   11/30/2017, 2:15 PM

## 2017-11-30 NOTE — Patient Instructions (Addendum)
Chronic Back Pain When back pain lasts longer than 3 months, it is called chronic back pain.The cause of your back pain may not be known. Some common causes include:  Wear and tear (degenerative disease) of the bones, ligaments, or disks in your back.  Inflammation and stiffness in your back (arthritis).  People who have chronic back pain often go through certain periods in which the pain is more intense (flare-ups). Many people can learn to manage the pain with home care. Follow these instructions at home: Pay attention to any changes in your symptoms. Take these actions to help with your pain: Activity  Avoid bending and activities that make the problem worse.  Do not sit or stand in one place for long periods of time.  Take brief periods of rest throughout the day. This will reduce your pain. Resting in a lying or standing position is usually better than sitting to rest.  When you are resting for longer periods, mix in some mild activity or stretching between periods of rest. This will help to prevent stiffness and pain.  Get regular exercise. Ask your health care provider what activities are safe for you.  Do not lift anything that is heavier than 10 lb (4.5 kg). Always use proper lifting technique, which includes: ? Bending your knees. ? Keeping the load close to your body. ? Avoiding twisting. Managing pain  If directed, apply ice to the painful area. Your health care provider may recommend applying ice during the first 24-48 hours after a flare-up begins. ? Put ice in a plastic bag. ? Place a towel between your skin and the bag. ? Leave the ice on for 20 minutes, 2-3 times per day.  After icing, apply heat to the affected area as often as told by your health care provider. Use the heat source that your health care provider recommends, such as a moist heat pack or a heating pad. ? Place a towel between your skin and the heat source. ? Leave the heat on for 20-30  minutes. ? Remove the heat if your skin turns bright red. This is especially important if you are unable to feel pain, heat, or cold. You may have a greater risk of getting burned.  Try soaking in a warm tub.  Take over-the-counter and prescription medicines only as told by your health care provider.  Keep all follow-up visits as told by your health care provider. This is important. Contact a health care provider if:  You have pain that is not relieved with rest or medicine. Get help right away if:  You have weakness or numbness in one or both of your legs or feet.  You have trouble controlling your bladder or your bowels.  You have nausea or vomiting.  You have pain in your abdomen.  You have shortness of breath or you faint. This information is not intended to replace advice given to you by your health care provider. Make sure you discuss any questions you have with your health care provider. Document Released: 07/17/2004 Document Revised: 10/18/2015 Document Reviewed: 11/27/2014 Elsevier Interactive Patient Education  2018 [employer].  Heartburn Heartburn is a type of pain or discomfort that can happen in the throat or chest. It is often described as a burning pain. It may also cause a bad taste in the mouth. Heartburn may feel worse when you lie down or bend over. It may be caused by stomach contents that move back up (reflux) into the tube that connects the  mouth with the stomach (esophagus). Follow these instructions at home: Take these actions to lessen your discomfort and to help avoid problems. Diet  Follow a diet as told by your doctor. You may need to avoid foods and drinks such as: ? Coffee and tea (with or without caffeine). ? Drinks that contain alcohol. ? Energy drinks and sports drinks. ? Carbonated drinks or sodas. ? Chocolate and cocoa. ? Peppermint and mint flavorings. ? Garlic and onions. ? Horseradish. ? Spicy and acidic foods, such as peppers, chili  powder, curry powder, vinegar, hot sauces, and BBQ sauce. ? Citrus fruit juices and citrus fruits, such as oranges, lemons, and limes. ? Tomato-based foods, such as red sauce, chili, salsa, and pizza with red sauce. ? Fried and fatty foods, such as donuts, french fries, potato chips, and high-fat dressings. ? High-fat meats, such as hot dogs, rib eye steak, sausage, ham, and bacon. ? High-fat dairy items, such as whole milk, butter, and cream cheese.  Eat small meals often. Avoid eating large meals.  Avoid drinking large amounts of liquid with your meals.  Avoid eating meals during the 2-3 hours before bedtime.  Avoid lying down right after you eat.  Do not exercise right after you eat. General instructions  Pay attention to any changes in your symptoms.  Take over-the-counter and prescription medicines only as told by your doctor. Do not take aspirin, ibuprofen, or other NSAIDs unless your doctor says it is okay.  Do not use any tobacco products, including cigarettes, chewing tobacco, and e-cigarettes. If you need help quitting, ask your doctor.  Wear loose clothes. Do not wear anything tight around your waist.  Raise (elevate) the head of your bed about 6 inches (15 cm).  Try to lower your stress. If you need help doing this, ask your doctor.  If you are overweight, lose an amount of weight that is healthy for you. Ask your doctor about a safe weight loss goal.  Keep all follow-up visits as told by your doctor. This is important. Contact a doctor if:  You have new symptoms.  You lose weight and you do not know why it is happening.  You have trouble swallowing, or it hurts to swallow.  You have wheezing or a cough that keeps happening.  Your symptoms do not get better with treatment.  You have heartburn often for more than two weeks. Get help right away if:  You have pain in your arms, neck, jaw, teeth, or back.  You feel sweaty, dizzy, or light-headed.  You have  chest pain or shortness of breath.  You throw up (vomit) and your throw up looks like blood or coffee grounds.  Your poop (stool) is bloody or black. This information is not intended to replace advice given to you by your health care provider. Make sure you discuss any questions you have with your health care provider. Document Released: 02/19/2011 Document Revised: 11/15/2015 Document Reviewed: 10/04/2014 Elsevier Interactive Patient Education  [employer]2018 Else[employer] Inc[employer]

## 2017-12-01 LAB — CMP14+EGFR
ALBUMIN: 4.2 g/dL (ref 3.5–5.5)
ALK PHOS: 77 IU/L (ref 39–117)
ALT: 14 IU/L (ref 0–44)
AST: 13 IU/L (ref 0–40)
Albumin/Globulin Ratio: 1.5 (ref 1.2–2.2)
BILIRUBIN TOTAL: 0.5 mg/dL (ref 0.0–1.2)
BUN / CREAT RATIO: 6 — AB (ref 9–20)
BUN: 7 mg/dL (ref 6–24)
CHLORIDE: 99 mmol/L (ref 96–106)
CO2: 24 mmol/L (ref 20–29)
Calcium: 9.1 mg/dL (ref 8.7–10.2)
Creatinine, Ser: 1.21 mg/dL (ref 0.76–1.27)
GFR calc non Af Amer: 67 mL/min/{1.73_m2} (ref >59)
GFR, EST AFRICAN AMERICAN: 77 mL/min/{1.73_m2} (ref >59)
GLUCOSE: 84 mg/dL (ref 65–99)
Globulin, Total: 2.8 g/dL (ref 1.5–4.5)
POTASSIUM: 4.6 mmol/L (ref 3.5–5.2)
Sodium: 135 mmol/L (ref 134–144)
TOTAL PROTEIN: 7 g/dL (ref 6.0–8.5)

## 2017-12-01 LAB — CBC
HEMOGLOBIN: 11.6 g/dL — AB (ref 13.0–17.7)
Hematocrit: 35.8 % — ABNORMAL LOW (ref 37.5–51.0)
MCH: 27.2 pg (ref 26.6–33.0)
MCHC: 32.4 g/dL (ref 31.5–35.7)
MCV: 84 fL (ref 79–97)
Platelets: 338 10*3/uL (ref 150–450)
RBC: 4.26 x10E6/uL (ref 4.14–5.80)
RDW: 16.1 % — ABNORMAL HIGH (ref 12.3–15.4)
WBC: 9.1 10*3/uL (ref 3.4–10.8)

## 2017-12-01 LAB — LIPID PANEL
CHOLESTEROL TOTAL: 230 mg/dL — AB (ref 100–199)
Chol/HDL Ratio: 5.1 ratio — ABNORMAL HIGH (ref 0.0–5.0)
HDL: 45 mg/dL (ref >39)
LDL Calculated: 155 mg/dL — ABNORMAL HIGH (ref 0–99)
Triglycerides: 152 mg/dL — ABNORMAL HIGH (ref 0–149)
VLDL CHOLESTEROL CAL: 30 mg/dL (ref 5–40)

## 2017-12-01 LAB — HIV ANTIBODY (ROUTINE TESTING W REFLEX): HIV Screen 4th Generation wRfx: NONREACTIVE

## 2017-12-01 LAB — PSA: PROSTATE SPECIFIC AG, SERUM: 0.5 ng/mL (ref 0.0–4.0)

## 2017-12-01 LAB — HEPATITIS C ANTIBODY

## 2017-12-02 ENCOUNTER — Ambulatory Visit (HOSPITAL_COMMUNITY)
Admission: RE | Admit: 2017-12-02 | Discharge: 2017-12-02 | Disposition: A | Discharge status: Home / Self Care | Payer: Self-pay | Source: Ambulatory Visit | Attending: Nurse Practitioner | Admitting: Nurse Practitioner

## 2017-12-02 DIAGNOSIS — G8929 Other chronic pain: Secondary | ICD-10-CM | POA: Insufficient documentation

## 2017-12-02 DIAGNOSIS — M5442 Lumbago with sciatica, left side: Secondary | ICD-10-CM | POA: Insufficient documentation

## 2017-12-02 DIAGNOSIS — M5441 Lumbago with sciatica, right side: Secondary | ICD-10-CM | POA: Insufficient documentation

## 2017-12-03 ENCOUNTER — Ambulatory Visit (HOSPITAL_COMMUNITY)
Admission: RE | Admit: 2017-12-03 | Discharge: 2017-12-03 | Disposition: A | Discharge status: Home / Self Care | Payer: Self-pay | Source: Ambulatory Visit | Attending: Nurse Practitioner | Admitting: Nurse Practitioner

## 2017-12-03 DIAGNOSIS — R221 Localized swelling, mass and lump, neck: Secondary | ICD-10-CM | POA: Insufficient documentation

## 2018-01-11 ENCOUNTER — Ambulatory Visit: Payer: Self-pay | Admitting: Nurse Practitioner

## 2018-01-27 ENCOUNTER — Ambulatory Visit: Payer: Self-pay | Attending: Nurse Practitioner | Admitting: Nurse Practitioner

## 2018-01-27 ENCOUNTER — Encounter: Payer: Self-pay | Admitting: Nurse Practitioner

## 2018-01-27 VITALS — BP 126/83 | HR 83 | Temp 97.8°F | Ht 66.0 in | Wt 166.4 lb

## 2018-01-27 DIAGNOSIS — K089 Disorder of teeth and supporting structures, unspecified: Secondary | ICD-10-CM | POA: Insufficient documentation

## 2018-01-27 DIAGNOSIS — Z9889 Other specified postprocedural states: Secondary | ICD-10-CM | POA: Insufficient documentation

## 2018-01-27 DIAGNOSIS — M5441 Lumbago with sciatica, right side: Secondary | ICD-10-CM | POA: Insufficient documentation

## 2018-01-27 DIAGNOSIS — G8929 Other chronic pain: Secondary | ICD-10-CM | POA: Insufficient documentation

## 2018-01-27 DIAGNOSIS — M5442 Lumbago with sciatica, left side: Secondary | ICD-10-CM | POA: Insufficient documentation

## 2018-01-27 DIAGNOSIS — Z791 Long term (current) use of non-steroidal anti-inflammatories (NSAID): Secondary | ICD-10-CM | POA: Insufficient documentation

## 2018-01-27 DIAGNOSIS — Z79899 Other long term (current) drug therapy: Secondary | ICD-10-CM | POA: Insufficient documentation

## 2018-01-27 DIAGNOSIS — K219 Gastro-esophageal reflux disease without esophagitis: Secondary | ICD-10-CM | POA: Insufficient documentation

## 2018-01-27 MED ORDER — NAPROXEN 500 MG PO TABS: 500.0000 mg | ORAL_TABLET | Freq: Two times a day (BID) | ORAL | 1 refills | Status: DC

## 2018-01-27 MED ORDER — OMEPRAZOLE 20 MG PO CPDR: 20.0000 mg | DELAYED_RELEASE_CAPSULE | Freq: Every day | ORAL | 3 refills | Status: DC

## 2018-01-27 MED ORDER — CYCLOBENZAPRINE HCL 10 MG PO TABS: 10.0000 mg | ORAL_TABLET | Freq: Three times a day (TID) | ORAL | 1 refills | Status: DC | PRN

## 2018-01-27 MED FILL — ?OMEPRazole 20mg CPDR: 20 | 30 days supply | Qty: 30 | Fill #1

## 2018-01-27 MED FILL — CYCLOBENZAPRINE 10 MG TAB: 10 | 20 days supply | Qty: 60 | Fill #1

## 2018-01-27 MED FILL — NAPROXEN 500 MG TABLET: 500 | 30 days supply | Qty: 60 | Fill #1

## 2018-01-27 NOTE — Progress Notes (Signed)
Assessment & Plan:  Kenneth Lucero was seen today for follow-up and dental problem.  Diagnoses and all orders for this visit:  Chronic bilateral low back pain with bilateral sciatica -     naproxen (NAPROSYN) 500 MG tablet; Take 1 tablet (500 mg total) by mouth 2 (two) times daily with a meal. -     cyclobenzaprine (FLEXERIL) 10 MG tablet; Take 1 tablet (10 mg total) by mouth 3 (three) times daily as needed for muscle spasms.  Gastroesophageal reflux disease, esophagitis presence not specified -     omeprazole (PRILOSEC) 20 MG capsule; Take 1 capsule (20 mg total) by mouth daily.  Poor dentition -     Ambulatory referral to Dentistry    Patient has been counseled on age-appropriate routine health concerns for screening and prevention. These are reviewed and up-to-date. Referrals have been placed accordingly. Immunizations are up-to-date or declined.    Subjective:   Chief Complaint  Patient presents with  . Follow-up    Pt. is here to follow-up on back pain.   . Dental Problem    Pt. would like a dental referral.   HPI Kenneth Lucero 57 y.o. male presents to office today for follow up to back pain.   Chronic Bilateral Low Back Pain Chronic. Onset >1 year. Inciting event: Back injury picking up a large tree. Pain is well controlled on naproxen and tizanidine. Aggravating factors: Bending, twisting, stooping, sitting or standing for prolonged periods of time.   GERD Chronic. Stable. Well controlled with omeprazole.    Review of Systems  Constitutional: Negative for fever, malaise/fatigue and weight loss.  HENT: Negative.  Negative for nosebleeds.   Eyes: Negative.  Negative for blurred vision, double vision and photophobia.  Respiratory: Negative.  Negative for cough and shortness of breath.   Cardiovascular: Negative.  Negative for chest pain, palpitations and leg swelling.  Gastrointestinal: Negative.  Negative for heartburn, nausea and vomiting.  Musculoskeletal: Negative.   Negative for myalgias.  Neurological: Negative.  Negative for dizziness, focal weakness, seizures and headaches.  Psychiatric/Behavioral: Negative.  Negative for suicidal ideas.    History reviewed. No pertinent past medical history.  Past Surgical History:  Procedure Laterality Date  . KNEE SURGERY Right     Family History  Problem Relation Age of Onset  . Cancer Neg Hx   . Diabetes Neg Hx   . Hypertension Neg Hx     Social History Reviewed with no changes to be made today.   Outpatient Medications Prior to Visit  Medication Sig Dispense Refill  . cyclobenzaprine (FLEXERIL) 10 MG tablet Take 1 tablet (10 mg total) by mouth 3 (three) times daily as needed for muscle spasms. 60 tablet 1  . naproxen (NAPROSYN) 500 MG tablet Take 1 tablet (500 mg total) by mouth 2 (two) times daily with a meal. 60 tablet 1  . omeprazole (PRILOSEC) 20 MG capsule Take 1 capsule (20 mg total) by mouth daily. 30 capsule 3   No facility-administered medications prior to visit.     No Known Allergies     Objective:    BP 126/83 (BP Location: Left Arm, Patient Position: Sitting, Cuff Size: Normal)   Pulse 83   Temp 97.8 F (36.6 C) (Oral)   Ht 5\' 6"  (1.676 m)   Wt 166 lb 6.4 oz (75.5 kg)   SpO2 98%   BMI 26.86 kg/m  Wt Readings from Last 3 Encounters:  01/27/18 166 lb 6.4 oz (75.5 kg)  11/30/17 172 lb (78  kg)  09/30/17 177 lb (80.3 kg)    Physical Exam  Constitutional: He is oriented to person, place, and time. He appears well-developed and well-nourished. He is cooperative.  HENT:  Head: Normocephalic and atraumatic.  Mouth/Throat: Uvula is midline. Abnormal dentition.    Eyes: EOM are normal.  Neck: Normal range of motion.  Cardiovascular: Normal rate, regular rhythm and normal heart sounds. Exam reveals no gallop and no friction rub.  No murmur heard. Pulmonary/Chest: Effort normal and breath sounds normal. No tachypnea. No respiratory distress. He has no decreased breath sounds.  He has no wheezes. He has no rhonchi. He has no rales. He exhibits no tenderness.  Abdominal: Bowel sounds are normal.  Musculoskeletal: Normal range of motion. He exhibits no edema.  Neurological: He is alert and oriented to person, place, and time. Coordination normal.  Skin: Skin is warm and dry.  Psychiatric: He has a normal mood and affect. His behavior is normal. Judgment and thought content normal.  Nursing note and vitals reviewed.      Patient has been counseled extensively about nutrition and exercise as well as the importance of adherence with medications and regular follow-up. The patient was given clear instructions to go to ER or return to medical center if symptoms don't improve, worsen or new problems develop. The patient verbalized understanding.   Follow-up: Return in about 3 months (around 04/29/2018) for Physical ONLY no labs.   Claiborne RiggZelda W Jessup Ogas, FNP-BC [facility] [place], [place] [phone]   01/27/2018, 8:48 PM

## 2018-01-27 NOTE — Patient Instructions (Signed)

## 2018-03-10 MED FILL — NAPROXEN 500 MG TABLET: 500 | 30 days supply | Qty: 60 | Fill #0

## 2018-03-10 MED FILL — CYCLOBENZAPRINE 10 MG TAB: 10 | 20 days supply | Qty: 60 | Fill #0

## 2018-03-10 MED FILL — ?OMEPRazole 20mg CPDR: 20 | 30 days supply | Qty: 30 | Fill #0

## 2018-04-30 ENCOUNTER — Encounter: Payer: Self-pay | Admitting: Nurse Practitioner

## 2018-05-12 ENCOUNTER — Encounter: Payer: Self-pay | Admitting: Nurse Practitioner

## 2018-05-12 ENCOUNTER — Ambulatory Visit: Payer: Self-pay | Attending: Nurse Practitioner | Admitting: Nurse Practitioner

## 2018-05-12 VITALS — BP 127/85 | HR 89 | Temp 98.5°F | Ht 66.0 in | Wt 160.0 lb

## 2018-05-12 DIAGNOSIS — Z0000 Encounter for general adult medical examination without abnormal findings: Secondary | ICD-10-CM

## 2018-05-12 DIAGNOSIS — K219 Gastro-esophageal reflux disease without esophagitis: Secondary | ICD-10-CM | POA: Insufficient documentation

## 2018-05-12 DIAGNOSIS — Z79899 Other long term (current) drug therapy: Secondary | ICD-10-CM | POA: Insufficient documentation

## 2018-05-12 DIAGNOSIS — Z0001 Encounter for general adult medical examination with abnormal findings: Secondary | ICD-10-CM | POA: Insufficient documentation

## 2018-05-12 DIAGNOSIS — G8929 Other chronic pain: Secondary | ICD-10-CM | POA: Insufficient documentation

## 2018-05-12 DIAGNOSIS — Z791 Long term (current) use of non-steroidal anti-inflammatories (NSAID): Secondary | ICD-10-CM | POA: Insufficient documentation

## 2018-05-12 DIAGNOSIS — M5442 Lumbago with sciatica, left side: Secondary | ICD-10-CM | POA: Insufficient documentation

## 2018-05-12 DIAGNOSIS — M5441 Lumbago with sciatica, right side: Secondary | ICD-10-CM | POA: Insufficient documentation

## 2018-05-12 DIAGNOSIS — E782 Mixed hyperlipidemia: Secondary | ICD-10-CM | POA: Insufficient documentation

## 2018-05-12 DIAGNOSIS — Z1211 Encounter for screening for malignant neoplasm of colon: Secondary | ICD-10-CM

## 2018-05-12 MED ORDER — NAPROXEN 500 MG PO TABS: 500.0000 mg | ORAL_TABLET | Freq: Two times a day (BID) | ORAL | 1 refills | Status: AC

## 2018-05-12 MED ORDER — CYCLOBENZAPRINE HCL 10 MG PO TABS: 10.0000 mg | ORAL_TABLET | Freq: Three times a day (TID) | ORAL | 1 refills | Status: AC | PRN

## 2018-05-12 MED ORDER — OMEPRAZOLE 20 MG PO CPDR: 20.0000 mg | DELAYED_RELEASE_CAPSULE | Freq: Every day | ORAL | 3 refills | Status: AC

## 2018-05-12 MED FILL — OMEPRAZOLE 20 MG CAP: 20 | 30 days supply | Qty: 30 | Fill #1

## 2018-05-12 MED FILL — NAPROXEN 500 MG TABLET: 500 | 30 days supply | Qty: 60 | Fill #1

## 2018-05-12 MED FILL — CYCLOBENZAPRINE 10 MG TAB: 10 | 20 days supply | Qty: 60 | Fill #1

## 2018-05-12 NOTE — Progress Notes (Deleted)
Established Patient Office Visit  Subjective:  Patient ID: Kenneth Lucero, male    DOB: 11/26/1960  Age: 57 y.o. MRN: [id number]  CC:  Chief Complaint  Patient presents with  . Annual Exam    Pt. is here for physical.     History reviewed. No pertinent past medical history.  Past Surgical History:  Procedure Laterality Date  . KNEE SURGERY Right     Family History  Problem Relation Age of Onset  . Cancer Neg Hx   . Diabetes Neg Hx   . Hypertension Neg Hx     Social History   Socioeconomic History  . Marital status: Single    Spouse name: Not on file  . Number of children: Not on file  . Years of education: Not on file  . Highest education level: Not on file  Occupational History  . Not on file  Social Needs  . Financial resource strain: Not on file  . Food insecurity:    Worry: Not on file    Inability: Not on file  . Transportation needs:    Medical: Not on file    Non-medical: Not on file  Tobacco Use  . Smoking status: Never Smoker  . Smokeless tobacco: Current User    Types: Chew  Substance and Sexual Activity  . Alcohol use: Not Currently    Alcohol/week: 1.0 standard drinks    Types: 1 Standard drinks or equivalent per week  . Drug use: No  . Sexual activity: Never  Lifestyle  . Physical activity:    Days per week: Not on file    Minutes per session: Not on file  . Stress: Not on file  Relationships  . Social connections:    Talks on phone: Not on file    Gets together: Not on file    Attends religious service: Not on file    Active member of club or organization: Not on file    Attends meetings of clubs or organizations: Not on file    Relationship status: Not on file  . Intimate partner violence:    Fear of current or ex partner: Not on file    Emotionally abused: Not on file    Physically abused: Not on file    Forced sexual activity: Not on file  Other Topics Concern  . Not on file  Social History Narrative  . Not on file     Outpatient Medications Prior to Visit  Medication Sig Dispense Refill  . cyclobenzaprine (FLEXERIL) 10 MG tablet Take 1 tablet (10 mg total) by mouth 3 (three) times daily as needed for muscle spasms. 60 tablet 1  . naproxen (NAPROSYN) 500 MG tablet Take 1 tablet (500 mg total) by mouth 2 (two) times daily with a meal. 60 tablet 1  . omeprazole (PRILOSEC) 20 MG capsule Take 1 capsule (20 mg total) by mouth daily. 90 capsule 3   No facility-administered medications prior to visit.     No Known Allergies  ROS Review of Systems    Objective:    Physical Exam  BP 127/85 (BP Location: Left Arm, Patient Position: Sitting, Cuff Size: Normal)   Pulse 89   Temp 98.5 F (36.9 C) (Oral)   Ht 5\' 6"  (1.676 m)   Wt 160 lb (72.6 kg)   SpO2 98%   BMI 25.82 kg/m  Wt Readings from Last 3 Encounters:  05/12/18 160 lb (72.6 kg)  01/27/18 166 lb 6.4 oz (75.5 kg)  11/30/17 172  lb (78 kg)     Health Maintenance Due  Topic Date Due  . TETANUS/TDAP  07/30/1981  . COLONOSCOPY  07/30/2012    There are no preventive care reminders to display for this patient.  No results found for: TSH Lab Results  Component Value Date   WBC 9.1 11/30/2017   HGB 11.6 (L) 11/30/2017   HCT 35.8 (L) 11/30/2017   MCV 84 11/30/2017   PLT 338 11/30/2017   Lab Results  Component Value Date   NA 135 11/30/2017   K 4.6 11/30/2017   CO2 24 11/30/2017   GLUCOSE 84 11/30/2017   BUN 7 11/30/2017   CREATININE 1.21 11/30/2017   BILITOT 0.5 11/30/2017   ALKPHOS 77 11/30/2017   AST 13 11/30/2017   ALT 14 11/30/2017   PROT 7.0 11/30/2017   ALBUMIN 4.2 11/30/2017   CALCIUM 9.1 11/30/2017   Lab Results  Component Value Date   CHOL 230 (H) 11/30/2017   Lab Results  Component Value Date   HDL 45 11/30/2017   Lab Results  Component Value Date   LDLCALC 155 (H) 11/30/2017   Lab Results  Component Value Date   TRIG 152 (H) 11/30/2017   Lab Results  Component Value Date   CHOLHDL 5.1 (H)  11/30/2017   No results found for: HGBA1C    Assessment & Plan:   Problem List Items Addressed This Visit      Nervous and Auditory   Chronic bilateral low back pain with bilateral sciatica   Relevant Medications   naproxen (NAPROSYN) 500 MG tablet   cyclobenzaprine (FLEXERIL) 10 MG tablet    Other Visit Diagnoses    Encounter for routine history and physical exam for male    -  Primary   Gastroesophageal reflux disease, esophagitis presence not specified       Relevant Medications   omeprazole (PRILOSEC) 20 MG capsule   Mixed hyperlipidemia       Relevant Orders   Lipid panel   Colon cancer screening       Relevant Orders   Fecal occult blood, imunochemical([facility])      Meds ordered this encounter  Medications  . naproxen (NAPROSYN) 500 MG tablet    Sig: Take 1 tablet (500 mg total) by mouth 2 (two) times daily with a meal.    Dispense:  60 tablet    Refill:  1    Order Specific Question:   Supervising Provider    Answer:   Hoy RegisterNEWLIN, ENOBONG [[id number]]  . omeprazole (PRILOSEC) 20 MG capsule    Sig: Take 1 capsule (20 mg total) by mouth daily.    Dispense:  90 capsule    Refill:  3    Order Specific Question:   Supervising Provider    Answer:   Hoy RegisterNEWLIN, ENOBONG [[id number]]  . cyclobenzaprine (FLEXERIL) 10 MG tablet    Sig: Take 1 tablet (10 mg total) by mouth 3 (three) times daily as needed for muscle spasms.    Dispense:  60 tablet    Refill:  1    Order Specific Question:   Supervising Provider    Answer:   Hoy RegisterNEWLIN, ENOBONG [[id number]]    Follow-up: Return in about 6 months (around 11/10/2018).    Claiborne RiggZelda W Fleming, NP

## 2018-05-12 NOTE — Progress Notes (Signed)
Assessment & Plan:  Kenneth MaduroRobert was seen today for annual exam.  Diagnoses and all orders for this visit:  Encounter for routine history and physical exam for male  Chronic bilateral low back pain with bilateral sciatica -     naproxen (NAPROSYN) 500 MG tablet; Take 1 tablet (500 mg total) by mouth 2 (two) times daily with a meal. -     cyclobenzaprine (FLEXERIL) 10 MG tablet; Take 1 tablet (10 mg total) by mouth 3 (three) times daily as needed for muscle spasms. Work on losing weight to help reduce back pain. May alternate with heat and ice application for pain relief. May also alternate with acetaminophen as prescribed for back pain. Other alternatives include massage, acupuncture and water aerobics.  You must stay active and avoid a sedentary lifestyle.    Gastroesophageal reflux disease, esophagitis presence not specified -     omeprazole (PRILOSEC) 20 MG capsule; Take 1 capsule (20 mg total) by mouth daily. INSTRUCTIONS: Avoid GERD Triggers: acidic, spicy or fried foods, caffeine, coffee, sodas,  alcohol and chocolate.   Mixed hyperlipidemia -     Lipid panel INSTRUCTIONS: Work on a low fat, heart healthy diet and participate in regular aerobic exercise program by working out at least 150 minutes per week; 5 days a week-30 minutes per day. Avoid red meat, fried foods. junk foods, sodas, sugary drinks, unhealthy snacking, alcohol and smoking.  Drink at least 48oz of water per day and monitor your carbohydrate intake daily.    Colon cancer screening -     Fecal occult blood, imunochemical([facility])    Patient has been counseled on age-appropriate routine health concerns for screening and prevention. These are reviewed and up-to-date. Referrals have been placed accordingly. Immunizations are up-to-date or declined.    Subjective:   Chief Complaint  Patient presents with  . Annual Exam    Pt. is here for physical.   HPI Kenneth Lucero 57 y.o. male presents to office  today for annual physical exam. He is requesting refills of his omeprazole, naproxen and flexeril.   Review of Systems  Constitutional: Negative for fever, malaise/fatigue and weight loss.  HENT: Negative.  Negative for nosebleeds.   Eyes: Negative.  Negative for blurred vision, double vision and photophobia.  Respiratory: Negative.  Negative for cough and shortness of breath.   Cardiovascular: Negative.  Negative for chest pain, palpitations and leg swelling.  Gastrointestinal: Positive for heartburn. Negative for nausea and vomiting.  Genitourinary: Negative.   Musculoskeletal: Positive for back pain and myalgias.  Skin: Negative.   Neurological: Negative.  Negative for dizziness, focal weakness, seizures and headaches.  Endo/Heme/Allergies: Negative.   Psychiatric/Behavioral: Negative.  Negative for suicidal ideas.    History reviewed. No pertinent past medical history.  Past Surgical History:  Procedure Laterality Date  . KNEE SURGERY Right     Family History  Problem Relation Age of Onset  . Cancer Neg Hx   . Diabetes Neg Hx   . Hypertension Neg Hx     Social History Reviewed with no changes to be made today.   Outpatient Medications Prior to Visit  Medication Sig Dispense Refill  . cyclobenzaprine (FLEXERIL) 10 MG tablet Take 1 tablet (10 mg total) by mouth 3 (three) times daily as needed for muscle spasms. 60 tablet 1  . naproxen (NAPROSYN) 500 MG tablet Take 1 tablet (500 mg total) by mouth 2 (two) times daily with a meal. 60 tablet 1  . omeprazole (PRILOSEC) 20 MG capsule Take  1 capsule (20 mg total) by mouth daily. 90 capsule 3   No facility-administered medications prior to visit.     No Known Allergies     Objective:    BP 127/85 (BP Location: Left Arm, Patient Position: Sitting, Cuff Size: Normal)   Pulse 89   Temp 98.5 F (36.9 C) (Oral)   Ht 5\' 6"  (1.676 m)   Wt 160 lb (72.6 kg)   SpO2 98%   BMI 25.82 kg/m  Wt Readings from Last 3 Encounters:    05/12/18 160 lb (72.6 kg)  01/27/18 166 lb 6.4 oz (75.5 kg)  11/30/17 172 lb (78 kg)    Physical Exam  Constitutional: He is oriented to person, place, and time. He appears well-developed and well-nourished.  HENT:  Head: Normocephalic and atraumatic.  Right Ear: Hearing, tympanic membrane, external ear and ear canal normal.  Left Ear: Hearing, tympanic membrane, external ear and ear canal normal.  Nose: Mucosal edema and rhinorrhea present.  Mouth/Throat: Uvula is midline, oropharynx is clear and moist and mucous membranes are normal. He does not have dentures. No oral lesions. No trismus in the jaw. Abnormal dentition. Dental caries present. No dental abscesses or uvula swelling. Tonsils are 1+ on the right. Tonsils are 1+ on the left. No tonsillar exudate.  Eyes: Pupils are equal, round, and reactive to light. Conjunctivae, EOM and lids are normal. No scleral icterus.  Neck: Trachea normal, normal range of motion and full passive range of motion without pain. Neck supple. No tracheal deviation present. No thyroid mass and no thyromegaly present.  Cardiovascular: Normal rate, regular rhythm, normal heart sounds and intact distal pulses. Exam reveals no gallop and no friction rub.  No murmur heard. Pulses:      Dorsalis pedis pulses are 2+ on the right side, and 2+ on the left side.       Posterior tibial pulses are 2+ on the right side, and 2+ on the left side.  Pulmonary/Chest: Effort normal and breath sounds normal. No respiratory distress. He has no decreased breath sounds. He has no wheezes. He has no rhonchi. He has no rales. He exhibits no mass and no tenderness. Right breast exhibits no inverted nipple, no mass, no nipple discharge, no skin change and no tenderness. Left breast exhibits no inverted nipple, no mass, no nipple discharge, no skin change and no tenderness.  Abdominal: Soft. Bowel sounds are normal. He exhibits no distension and no mass. There is no tenderness. There is no  rebound and no guarding. Hernia confirmed negative in the right inguinal area and confirmed negative in the left inguinal area.  Genitourinary: Testes normal and penis normal. Right testis shows no mass, no swelling and no tenderness. Right testis is descended. Cremasteric reflex is not absent on the right side. Left testis shows no mass, no swelling and no tenderness. Left testis is descended. Cremasteric reflex is not absent on the left side. Circumcised.  Musculoskeletal: Normal range of motion. He exhibits no edema, tenderness or deformity.       Lumbar back: He exhibits pain. He exhibits no swelling, no edema, no spasm and normal pulse.  Feet:  Right Foot:  Protective Sensation: 10 sites tested. 10 sites sensed.  Skin Integrity: Negative for skin breakdown.  Left Foot:  Protective Sensation: 10 sites tested. 10 sites sensed.  Skin Integrity: Negative for skin breakdown.  Lymphadenopathy:    He has no cervical adenopathy.    He has no axillary adenopathy. No inguinal adenopathy noted  on the right or left side.       Right: No inguinal adenopathy present.       Left: No inguinal adenopathy present.  Neurological: He is alert and oriented to person, place, and time. He has normal strength. He displays normal reflexes. No cranial nerve deficit or sensory deficit. He exhibits normal muscle tone. He displays a negative Romberg sign. Coordination and gait normal.  Reflex Scores:      Patellar reflexes are 2+ on the right side and 2+ on the left side. Skin: Skin is warm and dry. Capillary refill takes less than 2 seconds. No erythema.  Psychiatric: He has a normal mood and affect. His speech is normal and behavior is normal. Judgment and thought content normal. Cognition and memory are normal.      Patient has been counseled extensively about nutrition and exercise as well as the importance of adherence with medications and regular follow-up. The patient was given clear instructions to go to ER  or return to medical center if symptoms don't improve, worsen or new problems develop. The patient verbalized understanding.   Follow-up: Return in about 6 months (around 11/10/2018).   Claiborne Rigg, FNP-BC [facility] [place], [place] [phone]   05/12/2018, 8:22 PM

## 2018-05-13 LAB — LIPID PANEL
CHOL/HDL RATIO: 4.2 ratio (ref 0.0–5.0)
CHOLESTEROL TOTAL: 208 mg/dL — AB (ref 100–199)
HDL: 50 mg/dL (ref >39)
LDL CALC: 127 mg/dL — AB (ref 0–99)
TRIGLYCERIDES: 156 mg/dL — AB (ref 0–149)
VLDL Cholesterol Cal: 31 mg/dL (ref 5–40)

## 2018-05-18 ENCOUNTER — Other Ambulatory Visit: Payer: Self-pay | Admitting: Nurse Practitioner

## 2018-05-18 MED ORDER — ATORVASTATIN CALCIUM 20 MG PO TABS: 20.0000 mg | ORAL_TABLET | Freq: Every day | ORAL | 1 refills | Status: AC

## 2018-05-19 LAB — FECAL OCCULT BLOOD, IMMUNOCHEMICAL: FECAL OCCULT BLD: NEGATIVE

## 2018-05-19 MED FILL — ?ATORVASTATIN 20 MG TABLET: 20 | 30 days supply | Qty: 30 | Fill #0

## 2018-05-24 ENCOUNTER — Ambulatory Visit: Payer: Self-pay | Attending: Nurse Practitioner

## 2018-06-24 MED FILL — OMEPRAZOLE 20 MG CAP: 20 | 30 days supply | Qty: 30 | Fill #2

## 2018-06-24 MED FILL — ATORVASTATIN 20 MG TABLET: 20 | 30 days supply | Qty: 30 | Fill #1

## 2018-08-05 MED FILL — ?ATORVASTATIN 20 MG TABLET: 20 | 30 days supply | Qty: 30 | Fill #2

## 2018-08-05 MED FILL — ?OMEPRAZOLE 20 MG CAPSULE D: 20 | 30 days supply | Qty: 30 | Fill #3

## 2018-08-05 MED FILL — NAPROXEN 500 MG TABLET: 500 | 30 days supply | Qty: 60 | Fill #0

## 2018-08-05 MED FILL — CYCLOBENZAPRINE 10 MG TAB: 10 | 20 days supply | Qty: 60 | Fill #0

## 2018-09-06 ENCOUNTER — Ambulatory Visit: Payer: Self-pay | Attending: Nurse Practitioner

## 2018-09-06 ENCOUNTER — Other Ambulatory Visit: Payer: Self-pay

## 2019-09-14 IMAGING — US US SOFT TISSUE HEAD/NECK
1 series · 13 of 20 positions shown · non-contrast
Comparison: None.

CLINICAL DATA: Posterior right neck mass.

EXAM:
ULTRASOUND OF HEAD/NECK SOFT TISSUES
TECHNIQUE: Ultrasound examination of the head and neck soft tissues was
performed in the area of clinical concern.

[Series 1: us soft tissue head/neck · 0.06mm/px · 13 of 20 slices shown]
[im 1/20]
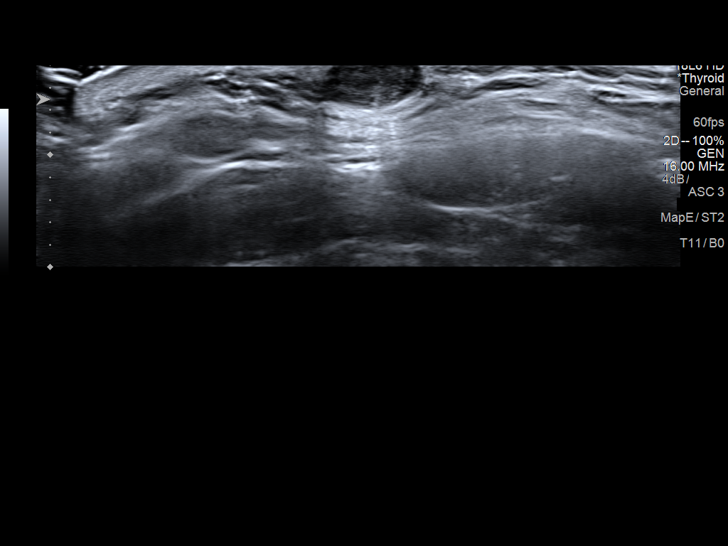
[im 3/20]
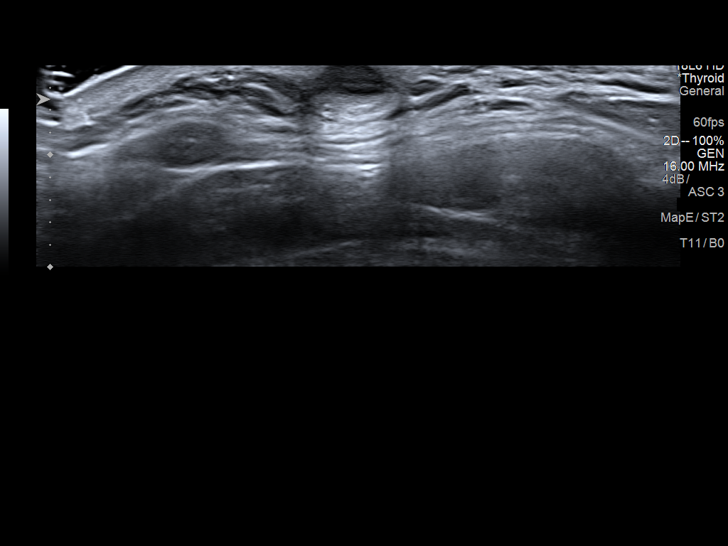
[im 4/20]
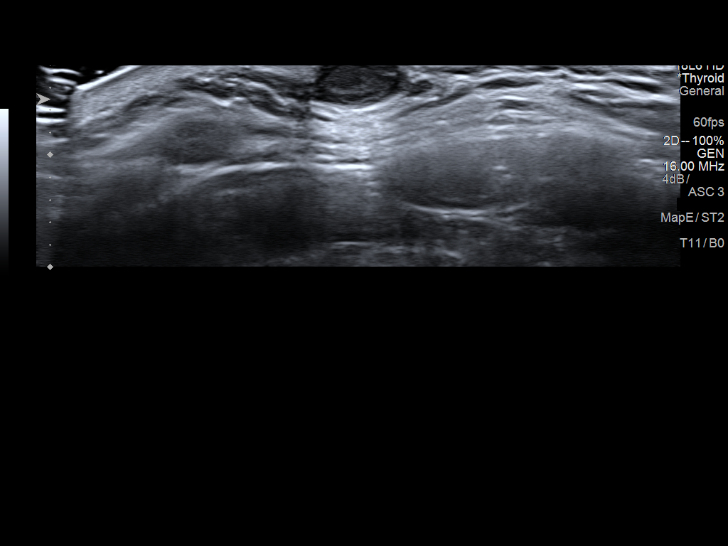
[im 6/20]
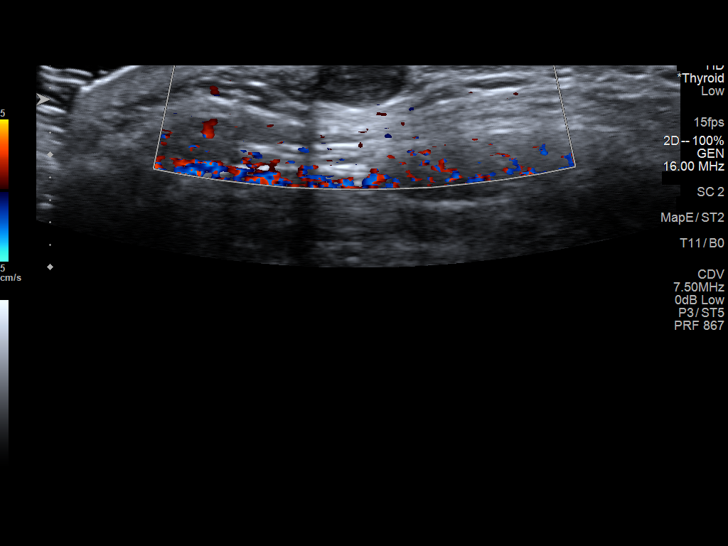
[im 7/20]
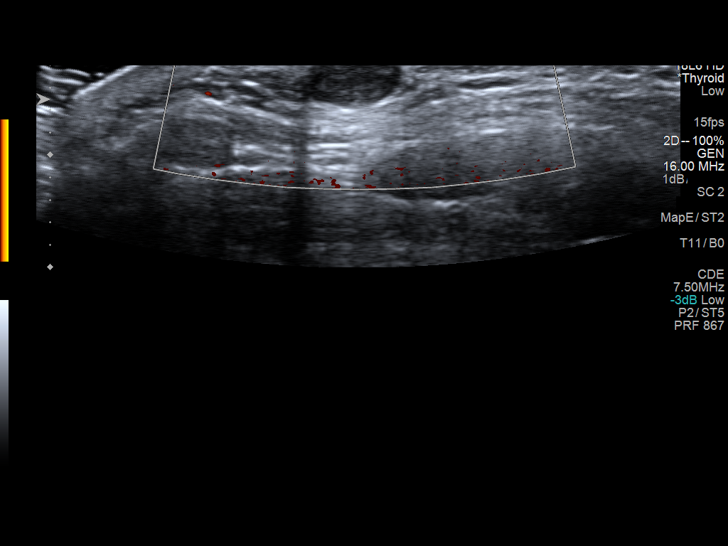
[im 9/20]
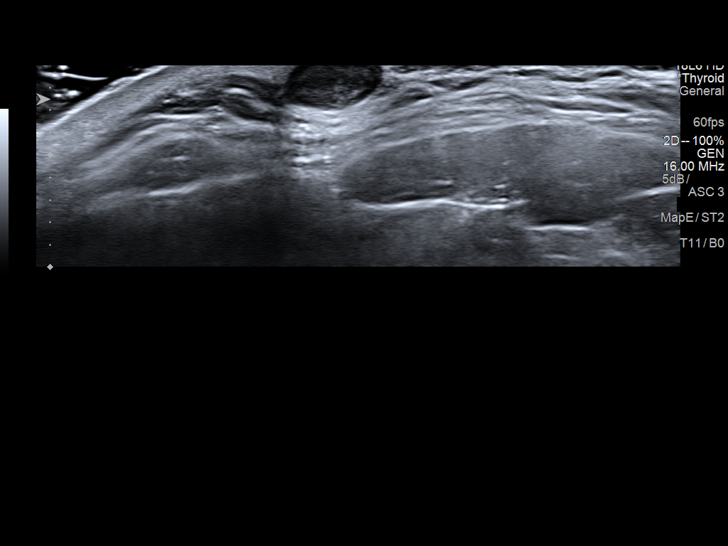
[im 11/20]
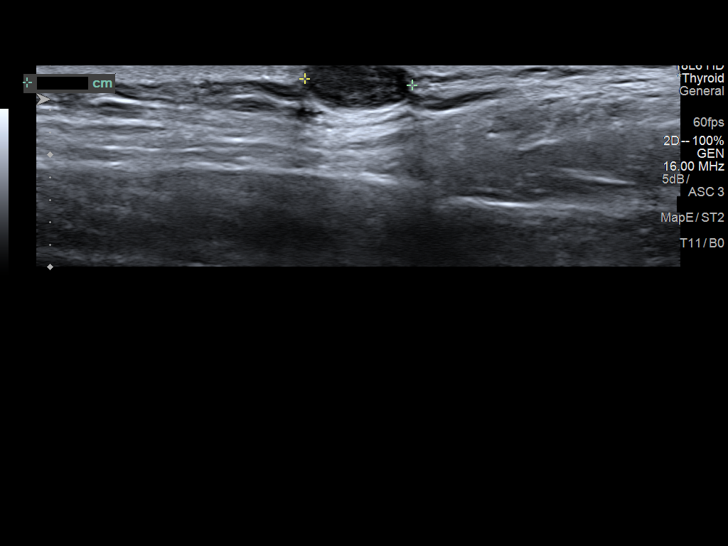
[im 12/20]
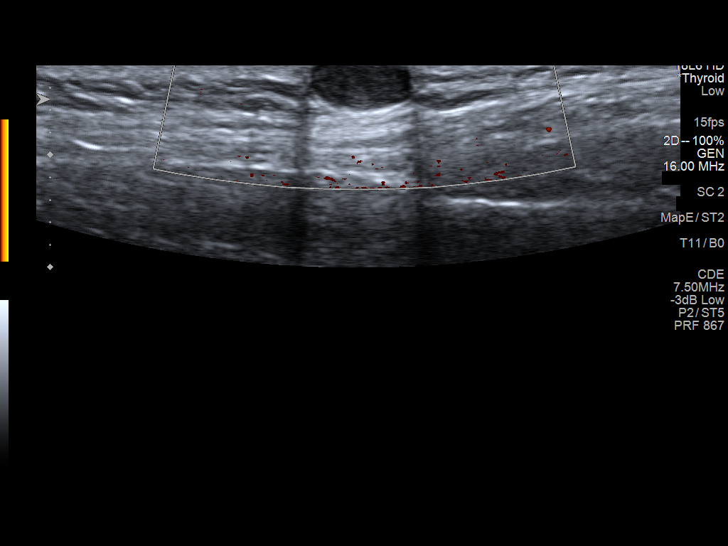
[im 14/20]
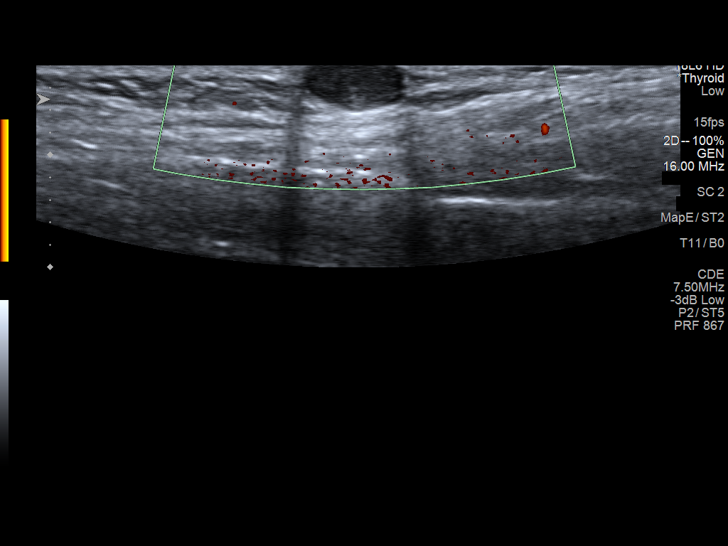
[im 15/20]
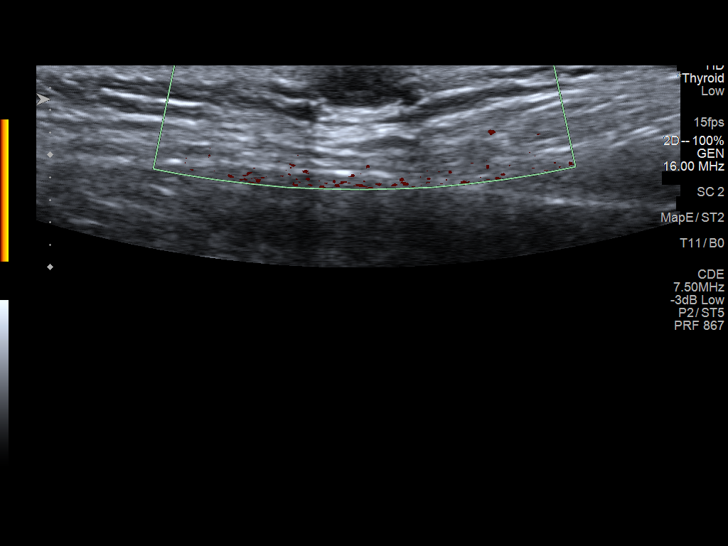
[im 17/20]
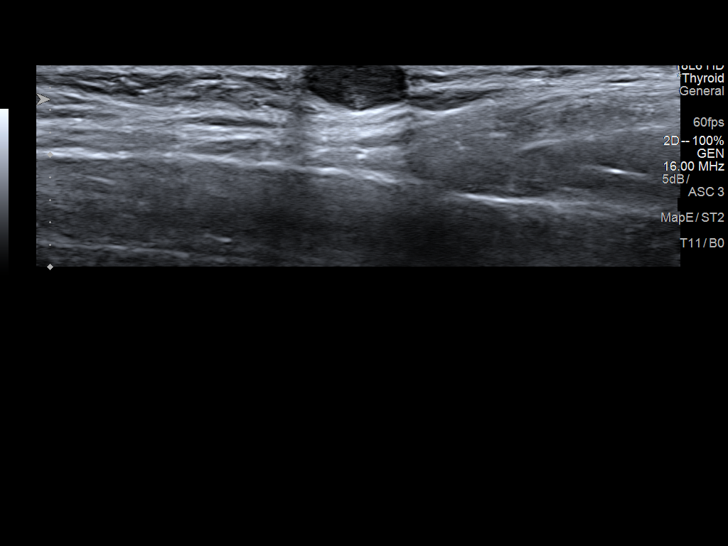
[im 18/20]
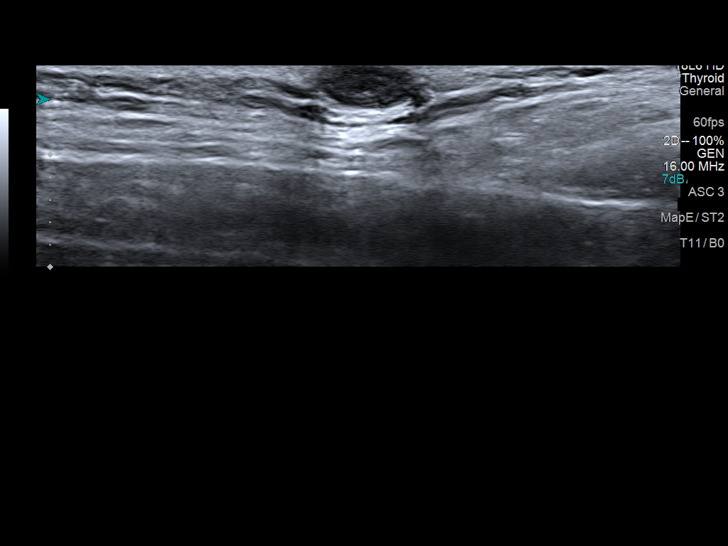
[im 20/20]
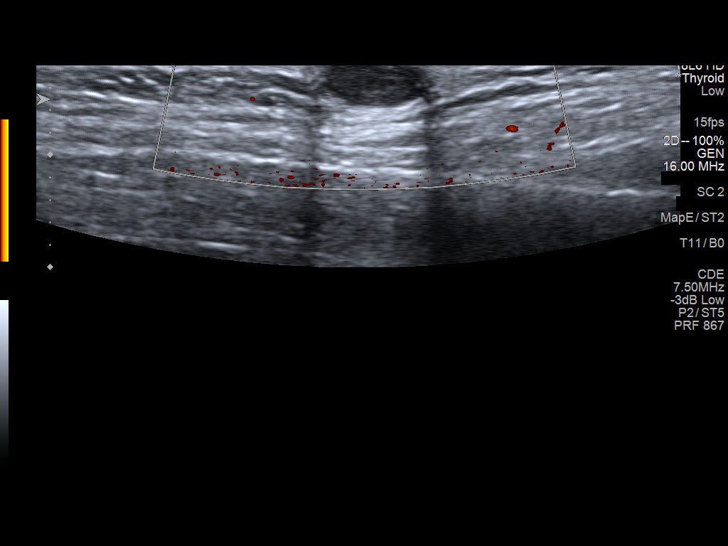

[13 of 20 positions shown; findings below may reference images not displayed]

FINDINGS: At the site of concern, in the posterior right neck, there is a
well-circumscribed 1.0 x 0.4 x 0.9 cm entity which is hypoechoic but
not purely cystic. This appears to be at the junction of the
subcutaneous fat in the deeper tissues. Color Doppler does not
demonstrate any flow. There is no discrete fatty hilum. Therefore,
this cannot be established with certainty to represent a lymph node.
It could be benign cyst or some other form small hypovascular mass.
By history, this has been present for several years. This, along
with the absence of specifically worrisome features, argue in favor
of a benign etiology.
IMPRESSION: Well-circumscribed ovoid 10 x 4 x 9 mm entity at the junction of the
subcutaneous fat and deeper tissues without demonstrable blood flow.
This could represent a cyst such as sebaceous cyst. Findings are
atypical for lymph node. Other small nonspecific soft tissue masses
are possible. The findings along with the clinical history certainly
favor a benign or very indolent nature.
